# Patient Record
Sex: Male | Born: 1990 | Race: White | Hispanic: No | Marital: Single | Smoking: Never smoker
Health system: Southern US, Community
[De-identification: ages and names within clinical notes are randomized; demographics above are authoritative.]

---

## 2014-11-12 ENCOUNTER — Ambulatory Visit (HOSPITAL_COMMUNITY): Payer: Self-pay | Attending: Emergency Medicine

## 2014-11-12 ENCOUNTER — Encounter (HOSPITAL_COMMUNITY): Payer: Self-pay | Admitting: Emergency Medicine

## 2014-11-12 ENCOUNTER — Emergency Department (HOSPITAL_COMMUNITY)
Admission: EM | Admit: 2014-11-12 | Discharge: 2014-11-12 | Disposition: A | Discharge status: Home / Self Care | Payer: BLUE CROSS/BLUE SHIELD | Source: Home / Self Care

## 2014-11-12 DIAGNOSIS — J189 Pneumonia, unspecified organism: Secondary | ICD-10-CM | POA: Diagnosis not present

## 2014-11-12 DIAGNOSIS — R05 Cough: Secondary | ICD-10-CM | POA: Insufficient documentation

## 2014-11-12 DIAGNOSIS — R918 Other nonspecific abnormal finding of lung field: Secondary | ICD-10-CM | POA: Insufficient documentation

## 2014-11-12 DIAGNOSIS — R509 Fever, unspecified: Secondary | ICD-10-CM | POA: Insufficient documentation

## 2014-11-12 DIAGNOSIS — R059 Cough, unspecified: Secondary | ICD-10-CM

## 2014-11-12 LAB — POCT RAPID STREP A: STREPTOCOCCUS, GROUP A SCREEN (DIRECT): NEGATIVE

## 2014-11-12 MED ORDER — AZITHROMYCIN 250 MG PO TABS: ORAL_TABLET | ORAL | Status: AC

## 2014-11-12 MED ORDER — CEFTRIAXONE SODIUM 1 G IJ SOLR
1.0000 g | Freq: Once | INTRAMUSCULAR | Status: AC
  Administered 2014-11-12: 1 g via INTRAMUSCULAR

## 2014-11-12 MED ORDER — CEFTRIAXONE SODIUM 1 G IJ SOLR
INTRAMUSCULAR | Status: AC
  Filled 2014-11-12: qty 10

## 2014-11-12 MED ORDER — CEFDINIR 300 MG PO CAPS: 300.0000 mg | ORAL_CAPSULE | Freq: Two times a day (BID) | ORAL | Status: AC

## 2014-11-12 MED ORDER — LIDOCAINE HCL (PF) 1 % IJ SOLN
INTRAMUSCULAR | Status: AC
  Filled 2014-11-12: qty 5

## 2014-11-12 NOTE — ED Notes (Signed)
Uri symptoms x 2 days ago.

## 2014-11-12 NOTE — Discharge Instructions (Signed)

## 2014-11-12 NOTE — ED Provider Notes (Signed)
Chief Complaint   URI   History of Present Illness   Dennis MomJoseph Stone is a 24 year old male who's had a three-day history of fever to 103.5, sweats, chills, malaise, fatigue, generalized aching, and headache. He's also had nasal congestion with clear rhinorrhea, sinus pressure, popping ears, and felt dizzy. He's had a cough productive of some green sputum and some wheezing. He has not had a flu vaccine this year. He's had no definite sick exposures.  Review of Systems   Other than as noted above, the patient denies any of the following symptoms: Systemic:  No fevers, chills, sweats, or myalgias. Eye:  No redness or discharge. ENT:  No ear pain, headache, nasal congestion, drainage, sinus pressure, or sore throat. Neck:  No neck pain, stiffness, or swollen glands. Lungs:  No cough, sputum production, hemoptysis, wheezing, chest tightness, shortness of breath or chest pain. GI:  No abdominal pain, nausea, vomiting or diarrhea.  PMFSH   Past medical history, family history, social history, meds, and allergies were reviewed.   Physical exam   Vital signs:  BP 97/65 mmHg  Pulse 95  Temp(Src) 100.4 F (38 C) (Oral)  Resp 20  SpO2 99% General:  Alert and oriented.  In no distress.  Skin warm and dry. Eye:  No conjunctival injection or drainage. Lids were normal. ENT:  TMs and canals were normal, without erythema or inflammation.  Nasal mucosa was clear and uncongested, without drainage.  Mucous membranes were moist.  Pharynx was erythematous with no exudate or drainage.  There were no oral ulcerations or lesions. Neck:  Supple, no adenopathy, tenderness or mass. Lungs:  No respiratory distress. He has scattered expiratory wheezes posteriorly, none anteriorly, no rales or rhonchi, good air movement bilaterally.  Heart:  Regular rhythm, without gallops, murmers or rubs. Skin:  Clear, warm, and dry, without rash or lesions.  Labs   Results for orders placed or performed during the  hospital encounter of 11/12/14  POCT rapid strep A [facility])  Result Value Ref Range   Streptococcus, Group A Screen (Direct) NEGATIVE NEGATIVE     Radiology   Dg Chest 2 View  11/12/2014   CLINICAL DATA:  Productive cough. Fever. Symptoms for 2 and half days.  EXAM: CHEST  2 VIEW  COMPARISON:  None.  FINDINGS: Minimal patchy airspace opacity in the right middle lobe. The left lung is clear. Cardiomediastinal contours are normal. Pulmonary vasculature is normal. There is no pleural effusion or pneumothorax. No osseous abnormality  IMPRESSION: Minimal patchy airspace opacity right middle lobe concerning for pneumonia.   Electronically Signed   By: Rubye OaksMelanie  Ehinger M.D.   On: 11/12/2014 19:16     Course in [facility]   The following medications were given:  Medications  cefTRIAXone (ROCEPHIN) injection 1 g (1 g Intramuscular Given 11/12/14 1945)   Assessment     The primary encounter diagnosis was Community acquired pneumonia. A diagnosis of Cough was also pertinent to this visit.  Plan    1.  Meds:  The following meds were prescribed:   Discharge Medication List as of 11/12/2014  7:55 PM    START taking these medications   Details  azithromycin (ZITHROMAX Z-PAK) 250 MG tablet Take as directed., Normal    cefdinir (OMNICEF) 300 MG capsule Take 1 capsule (300 mg total) by mouth 2 (two) times daily., Starting 11/12/2014, Until Discontinued, Normal        2.  Patient Education/Counseling:  The patient was given appropriate  handouts, self care instructions, and instructed in symptomatic relief.  Instructed to get extra fluids and extra rest.    3.  Follow up:  The patient was told to follow up here for a scheduled recheck in 2 days, or sooner if becoming worse in any way, and given some red flag symptoms such as increasing fever, difficulty breathing, chest pain, or persistent vomiting which would prompt immediate return.       Reuben Likes, MD 11/12/14 2017

## 2014-11-14 ENCOUNTER — Encounter (HOSPITAL_COMMUNITY): Payer: Self-pay | Admitting: *Deleted

## 2014-11-14 ENCOUNTER — Emergency Department (INDEPENDENT_AMBULATORY_CARE_PROVIDER_SITE_OTHER)
Admission: EM | Admit: 2014-11-14 | Discharge: 2014-11-14 | Disposition: A | Discharge status: Home / Self Care | Payer: BLUE CROSS/BLUE SHIELD | Source: Home / Self Care | Attending: Emergency Medicine | Admitting: Emergency Medicine

## 2014-11-14 DIAGNOSIS — J189 Pneumonia, unspecified organism: Secondary | ICD-10-CM

## 2014-11-14 LAB — CULTURE, GROUP A STREP

## 2014-11-14 MED ORDER — ALBUTEROL SULFATE HFA 108 (90 BASE) MCG/ACT IN AERS
INHALATION_SPRAY | RESPIRATORY_TRACT | Status: AC
  Filled 2014-11-14: qty 6.7

## 2014-11-14 MED ORDER — ALBUTEROL SULFATE HFA 108 (90 BASE) MCG/ACT IN AERS: 2.0000 | INHALATION_SPRAY | Freq: Four times a day (QID) | RESPIRATORY_TRACT | Status: DC

## 2014-11-14 NOTE — Discharge Instructions (Signed)
Get repeat Chest X-ray in 1 month.  Finish antibiotics.  Use inhaler as demonstrated.  Return if any worsening of symptoms.   How to Use an Inhaler Proper inhaler technique is very important. Good technique ensures that the medicine reaches the lungs. Poor technique results in depositing the medicine on the tongue and back of the throat rather than in the airways. If you do not use the inhaler with good technique, the medicine will not help you. STEPS TO FOLLOW IF USING AN INHALER WITHOUT AN EXTENSION TUBE 1. Remove the cap from the inhaler. 2. If you are using the inhaler for the first time, you will need to prime it. Shake the inhaler for 5 seconds and release four puffs into the air, away from your face. Ask your health care provider or pharmacist if you have questions about priming your inhaler. 3. Shake the inhaler for 5 seconds before each breath in (inhalation). 4. Position the inhaler so that the top of the canister faces up. 5. Put your index finger on the top of the medicine canister. Your thumb supports the bottom of the inhaler. 6. Open your mouth. 7. Either place the inhaler between your teeth and place your lips tightly around the mouthpiece, or hold the inhaler 1-2 inches away from your open mouth. If you are unsure of which technique to use, ask your health care provider. 8. Breathe out (exhale) normally and as completely as possible. 9. Press the canister down with your index finger to release the medicine. 10. At the same time as the canister is pressed, inhale deeply and slowly until your lungs are completely filled. This should take 4-6 seconds. Keep your tongue down. 11. Hold the medicine in your lungs for 5-10 seconds (10 seconds is best). This helps the medicine get into the small airways of your lungs. 12. Breathe out slowly, through pursed lips. Whistling is an example of pursed lips. 13. Wait at least 15-30 seconds between puffs. Continue with the above steps until you have  taken the number of puffs your health care provider has ordered. Do not use the inhaler more than your health care provider tells you. 14. Replace the cap on the inhaler. 15. Follow the directions from your health care provider or the inhaler insert for cleaning the inhaler. STEPS TO FOLLOW IF USING AN INHALER WITH AN EXTENSION (SPACER) 1. Remove the cap from the inhaler. 2. If you are using the inhaler for the first time, you will need to prime it. Shake the inhaler for 5 seconds and release four puffs into the air, away from your face. Ask your health care provider or pharmacist if you have questions about priming your inhaler. 3. Shake the inhaler for 5 seconds before each breath in (inhalation). 4. Place the open end of the spacer onto the mouthpiece of the inhaler. 5. Position the inhaler so that the top of the canister faces up and the spacer mouthpiece faces you. 6. Put your index finger on the top of the medicine canister. Your thumb supports the bottom of the inhaler and the spacer. 7. Breathe out (exhale) normally and as completely as possible. 8. Immediately after exhaling, place the spacer between your teeth and into your mouth. Close your lips tightly around the spacer. 9. Press the canister down with your index finger to release the medicine. 10. At the same time as the canister is pressed, inhale deeply and slowly until your lungs are completely filled. This should take 4-6 seconds. Keep your  tongue down and out of the way. 11. Hold the medicine in your lungs for 5-10 seconds (10 seconds is best). This helps the medicine get into the small airways of your lungs. Exhale. 12. Repeat inhaling deeply through the spacer mouthpiece. Again hold that breath for up to 10 seconds (10 seconds is best). Exhale slowly. If it is difficult to take this second deep breath through the spacer, breathe normally several times through the spacer. Remove the spacer from your mouth. 13. Wait at least 15-30  seconds between puffs. Continue with the above steps until you have taken the number of puffs your health care provider has ordered. Do not use the inhaler more than your health care provider tells you. 14. Remove the spacer from the inhaler, and place the cap on the inhaler. 15. Follow the directions from your health care provider or the inhaler insert for cleaning the inhaler and spacer. If you are using different kinds of inhalers, use your quick relief medicine to open the airways 10-15 minutes before using a steroid if instructed to do so by your health care provider. If you are unsure which inhalers to use and the order of using them, ask your health care provider, nurse, or respiratory therapist. If you are using a steroid inhaler, always rinse your mouth with water after your last puff, then gargle and spit out the water. Do not swallow the water. AVOID:  Inhaling before or after starting the spray of medicine. It takes practice to coordinate your breathing with triggering the spray.  Inhaling through the nose (rather than the mouth) when triggering the spray. HOW TO DETERMINE IF YOUR INHALER IS FULL OR NEARLY EMPTY You cannot know when an inhaler is empty by shaking it. A few inhalers are now being made with dose counters. Ask your health care provider for a prescription that has a dose counter if you feel you need that extra help. If your inhaler does not have a counter, ask your health care provider to help you determine the date you need to refill your inhaler. Write the refill date on a calendar or your inhaler canister. Refill your inhaler 7-10 days before it runs out. Be sure to keep an adequate supply of medicine. This includes making sure it is not expired, and that you have a spare inhaler.  SEEK MEDICAL CARE IF:   Your symptoms are only partially relieved with your inhaler.  You are having trouble using your inhaler.  You have some increase in phlegm. SEEK IMMEDIATE MEDICAL CARE  IF:   You feel little or no relief with your inhalers. You are still wheezing and are feeling shortness of breath or tightness in your chest or both.  You have dizziness, headaches, or a fast heart rate.  You have chills, fever, or night sweats.  You have a noticeable increase in phlegm production, or there is blood in the phlegm. MAKE SURE YOU:   Understand these instructions.  Will watch your condition.  Will get help right away if you are not doing well or get worse. Document Released: 10/11/2000 Document Revised: 08/04/2013 Document Reviewed: 05/13/2013 [facility] Patient Information 2015 [place]. This information is not intended to replace advice given to you by your health care provider. Make sure you discuss any questions you have with your health care provider.

## 2014-11-14 NOTE — ED Notes (Signed)
Pt was here 2 days ago and was diagnosed with pneumonia.  He is here for follow-up   He reports the fever is better, energy is much better.  He is coughing up a l;ot of sputum.

## 2014-11-14 NOTE — ED Provider Notes (Signed)
   Chief Complaint   Follow-up   History of Present Illness   Dennis Stone is a 24 year old male who was seen here 2 days ago with right middle lobe pneumonia. He has fever, cough, and aching. He was given Rocephin 1 g IM and was placed on azithromycin and Omnicef. He still having a cough productive of minimal amounts of clear to white sputum. No hemoptysis. His chest feels tight and he still having some wheezing. No chest pain or pleuritic pain. He still having low-grade fevers up to 100. He's felt chilled. He's had some sore throat, nasal congestion, and headache. He's tolerating his medicines well except for some diarrhea. No abdominal pain, nausea, or vomiting.  Review of Systems   Other than as noted above, the patient denies any of the following symptoms: Systemic:  No fevers, chills, sweats, or myalgias. Eye:  No redness or discharge. ENT:  No ear pain, headache, nasal congestion, drainage, sinus pressure, or sore throat. Neck:  No neck pain, stiffness, or swollen glands. Lungs:  No cough, sputum production, hemoptysis, wheezing, chest tightness, shortness of breath or chest pain. GI:  No abdominal pain, nausea, vomiting or diarrhea.  PMFSH   Past medical history, family history, social history, meds, and allergies were reviewed.   Physical exam   Vital signs:  BP 125/79 mmHg  Pulse 79  Temp(Src) 100.1 F (37.8 C) (Oral)  Resp 16  SpO2 97% General:  Alert and oriented.  In no distress.  Skin warm and dry. Eye:  No conjunctival injection or drainage. Lids were normal. ENT:  TMs and canals were normal, without erythema or inflammation.  Nasal mucosa was clear and uncongested, without drainage.  Mucous membranes were moist.  Pharynx was clear with no exudate or drainage.  There were no oral ulcerations or lesions. Neck:  Supple, no adenopathy, tenderness or mass. Lungs:  No respiratory distress.  He has some expiratory wheezes in his right lung heard anteriorly, no rales or  rhonchi, good air movement bilaterally.  Heart:  Regular rhythm, without gallops, murmers or rubs. Skin:  Clear, warm, and dry, without rash or lesions.  Course in [facility]   The following medications were given:  Medications  albuterol (PROVENTIL HFA;VENTOLIN HFA) 108 (90 BASE) MCG/ACT inhaler 2 puff (not administered)    He was instructed in use of Proventil inhaler.  Assessment     The encounter diagnosis was Community acquired pneumonia.  His pneumonia is getting better but very slowly. I do not think he needs repeat chest x-ray today. Continue with his antibiotics and had a Proventil inhaler. Return again in 3 or 4 days if his temperature isn't completely back to normal. Also instructed to return again in one month for a follow-up chest x-ray.  Plan    1.  Meds:  The following meds were prescribed:   Discharge Medication List as of 11/14/2014  2:13 PM      2.  Patient Education/Counseling:  The patient was given appropriate handouts, self care instructions, and instructed in symptomatic relief.  Instructed to get extra fluids and extra rest.    3.  Follow up:  The patient was told to follow up here if no better in 3 to 4 days, or sooner if becoming worse in any way, and given some red flag symptoms such as increasing fever, difficulty breathing, chest pain, or persistent vomiting which would prompt immediate return.       Reuben Likesavid C Shevy Yaney, MD 11/14/14 [phone]

## 2016-06-08 IMAGING — DX DG CHEST 2V
2 series · 2 of 2 positions shown · non-contrast
Comparison: None.

CLINICAL DATA: Productive cough. Fever. Symptoms for 2 and half
days.

EXAM:
CHEST  2 VIEW

[chest pa]
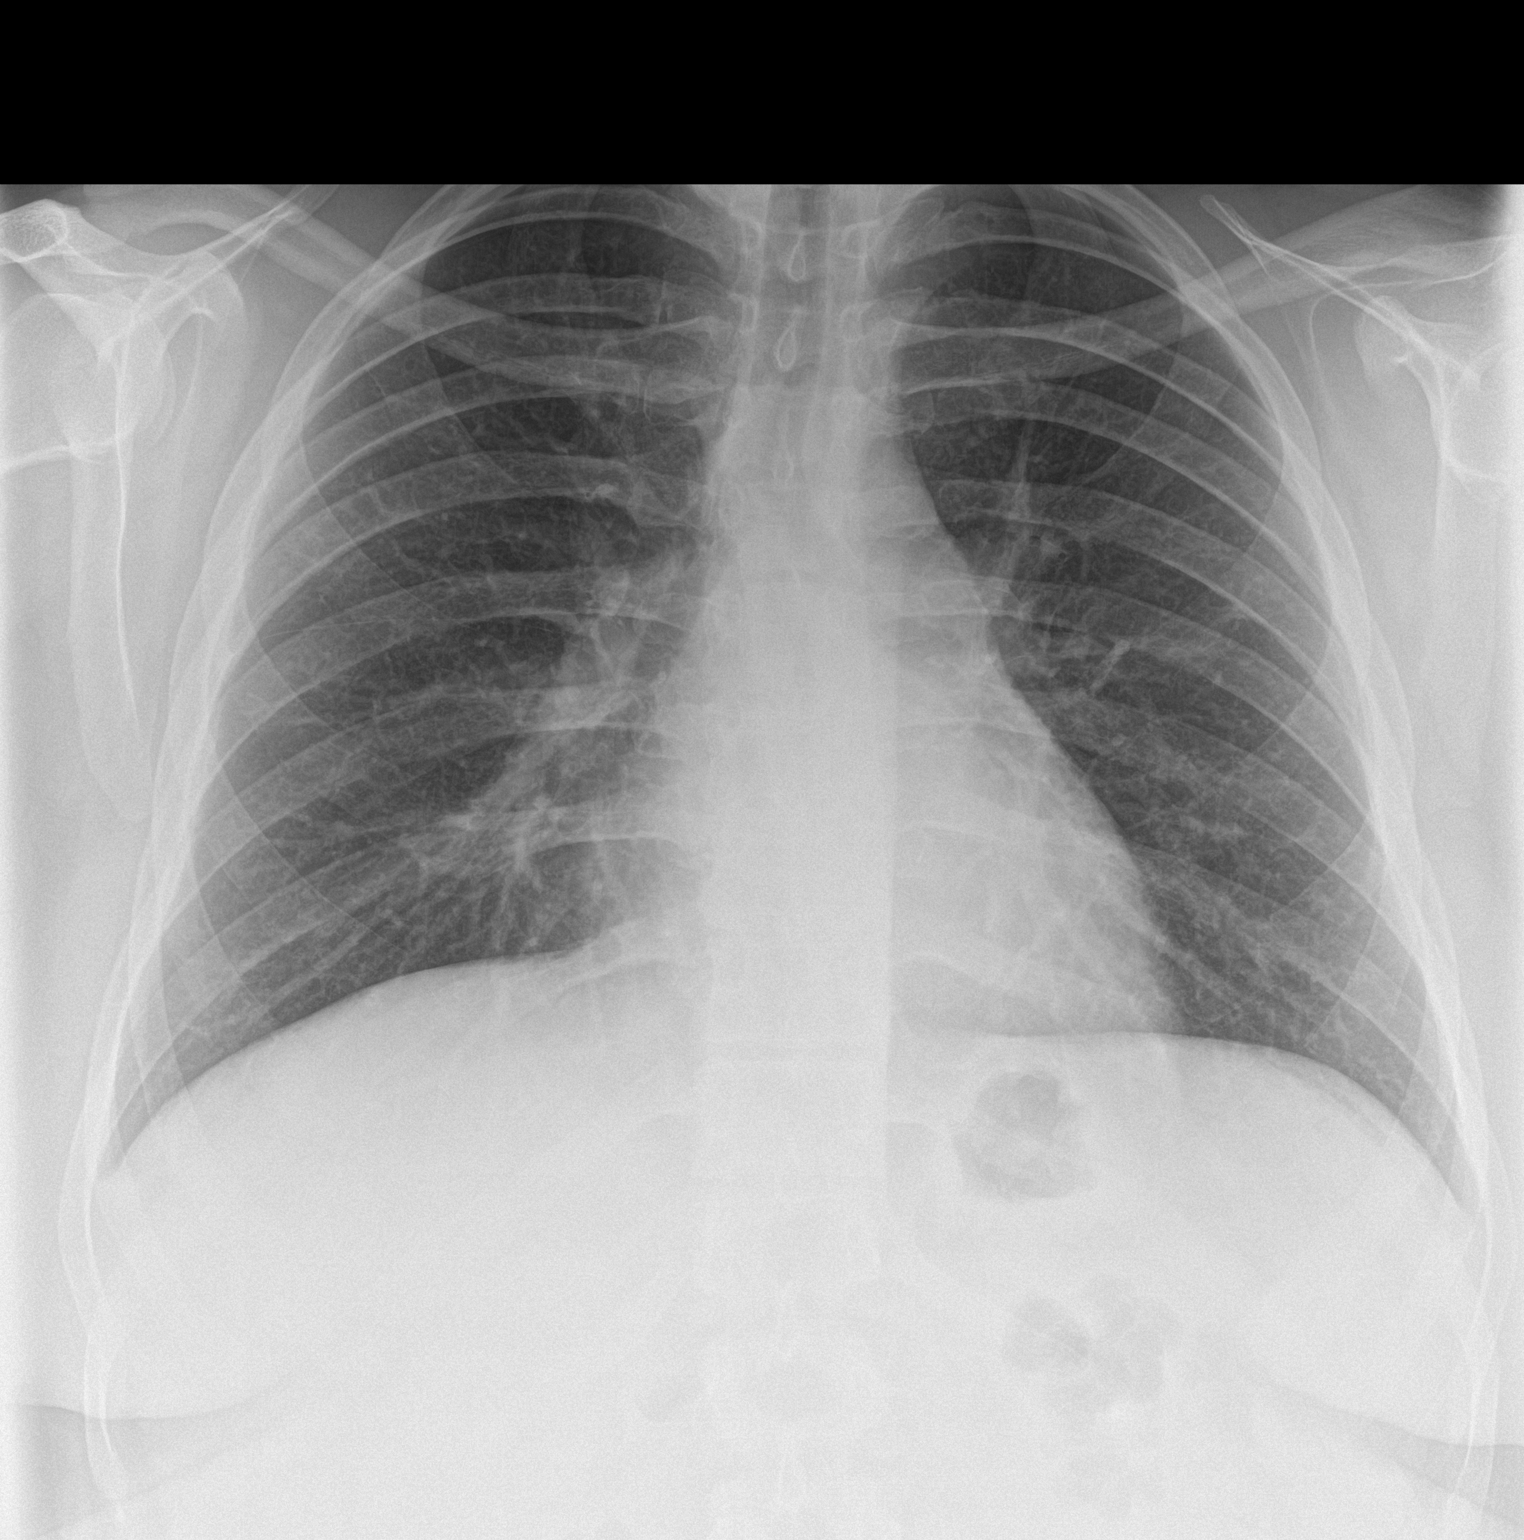

[chest lat]
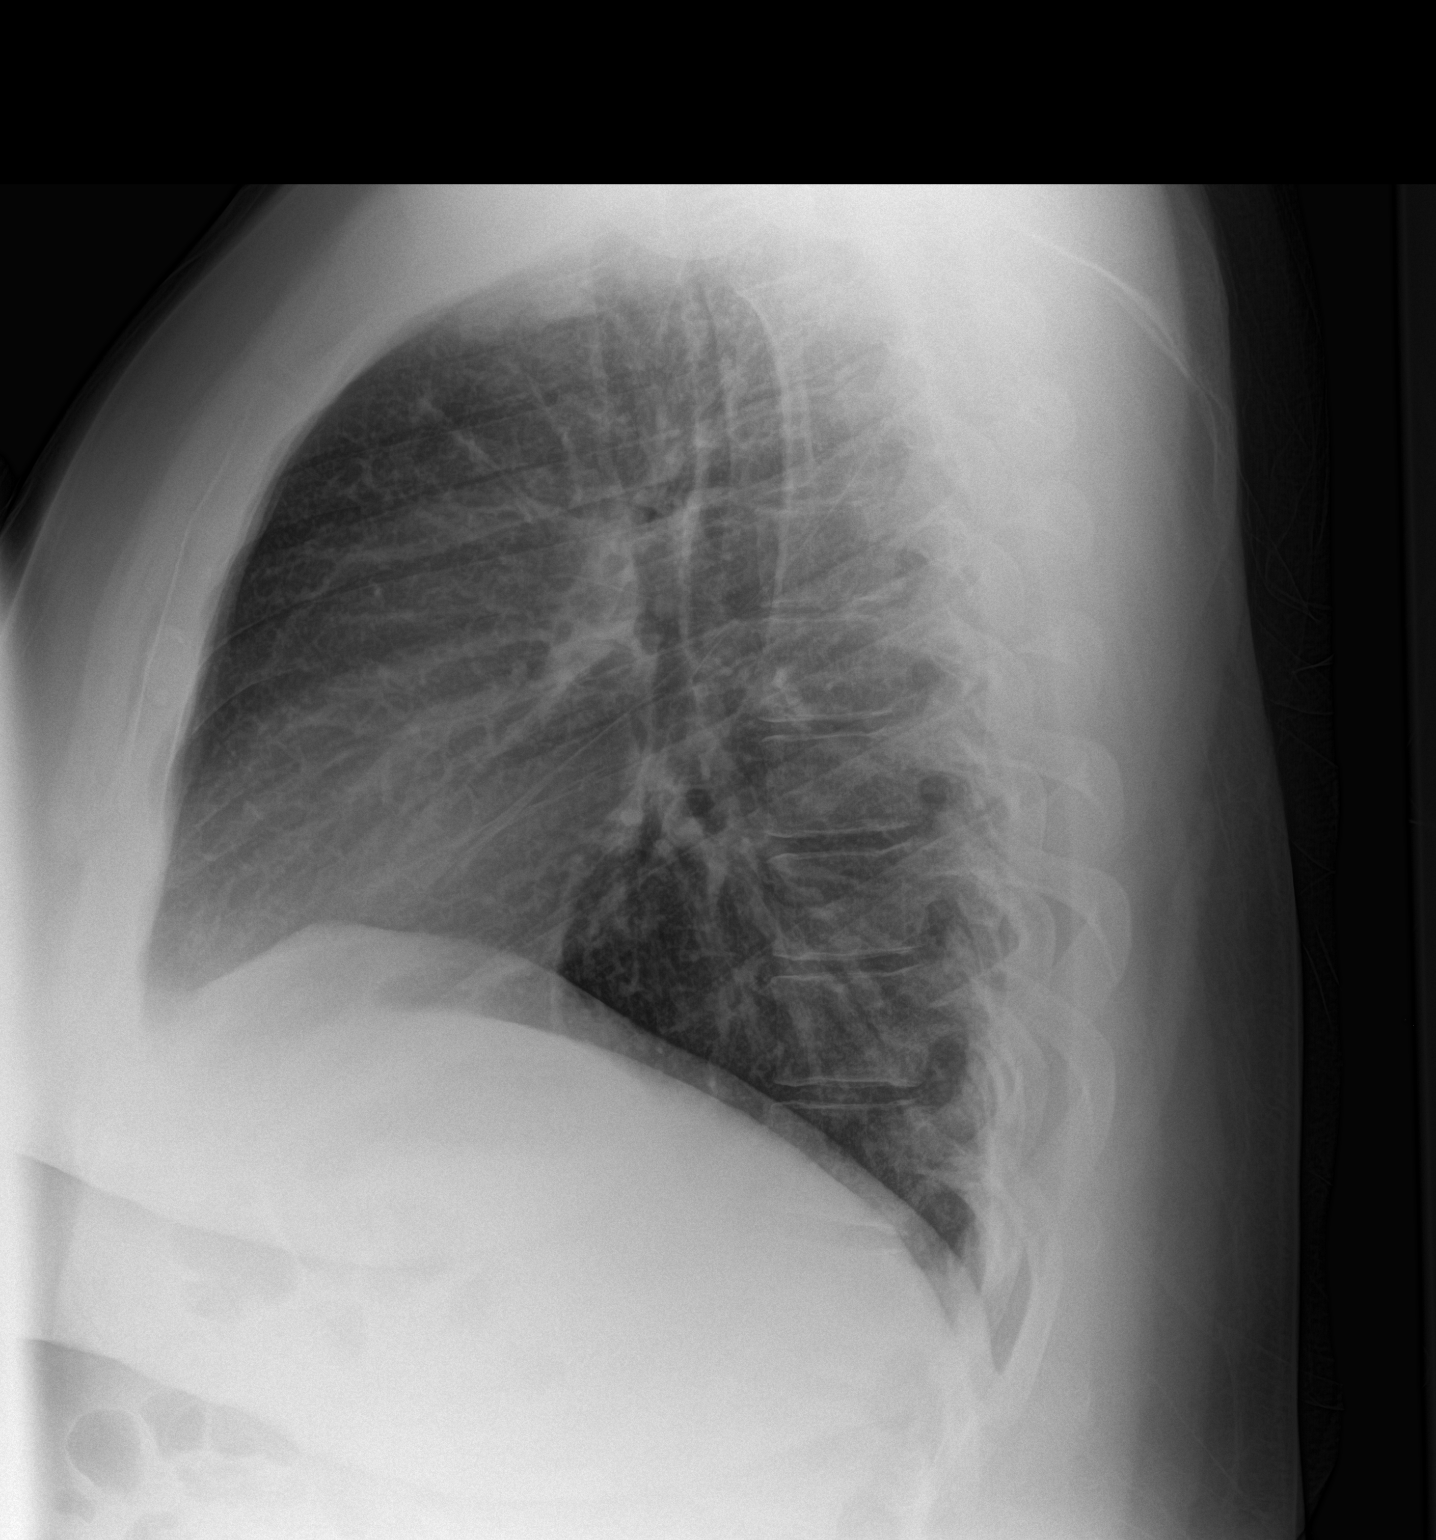

[2 of 2 positions shown; findings below may reference images not displayed]

FINDINGS: Minimal patchy airspace opacity in the right middle lobe. The left
lung is clear. Cardiomediastinal contours are normal. Pulmonary
vasculature is normal. There is no pleural effusion or pneumothorax.
No osseous abnormality
IMPRESSION: Minimal patchy airspace opacity right middle lobe concerning for
pneumonia.
